# Patient Record
Sex: Male | Born: 1945 | Race: Black or African American | Hispanic: No
Health system: Southern US, Community
[De-identification: ages and names within clinical notes are randomized; demographics above are authoritative.]

## PROBLEM LIST (undated history)

## (undated) DIAGNOSIS — I10 Essential (primary) hypertension: Secondary | ICD-10-CM

---

## 1998-08-09 ENCOUNTER — Ambulatory Visit (HOSPITAL_COMMUNITY): Admission: RE | Admit: 1998-08-09 | Discharge: 1998-08-09 | Payer: Self-pay | Admitting: Internal Medicine

## 1998-08-09 ENCOUNTER — Encounter: Payer: Self-pay | Admitting: Internal Medicine

## 1999-10-24 ENCOUNTER — Encounter (HOSPITAL_BASED_OUTPATIENT_CLINIC_OR_DEPARTMENT_OTHER): Payer: Self-pay | Admitting: General Surgery

## 1999-10-26 ENCOUNTER — Ambulatory Visit (HOSPITAL_COMMUNITY): Admission: RE | Admit: 1999-10-26 | Discharge: 1999-10-26 | Payer: Self-pay | Admitting: General Surgery

## 1999-10-26 ENCOUNTER — Encounter (INDEPENDENT_AMBULATORY_CARE_PROVIDER_SITE_OTHER): Payer: Self-pay | Admitting: Specialist

## 1999-11-02 ENCOUNTER — Encounter (HOSPITAL_BASED_OUTPATIENT_CLINIC_OR_DEPARTMENT_OTHER): Payer: Self-pay | Admitting: General Surgery

## 1999-11-02 ENCOUNTER — Ambulatory Visit (HOSPITAL_COMMUNITY): Admission: RE | Admit: 1999-11-02 | Discharge: 1999-11-02 | Payer: Self-pay | Admitting: General Surgery

## 2000-10-16 ENCOUNTER — Emergency Department (HOSPITAL_COMMUNITY): Admission: EM | Admit: 2000-10-16 | Discharge: 2000-10-17 | Payer: Self-pay | Admitting: Emergency Medicine

## 2003-12-06 ENCOUNTER — Emergency Department (HOSPITAL_COMMUNITY): Admission: EM | Admit: 2003-12-06 | Discharge: 2003-12-06 | Payer: Self-pay | Admitting: Emergency Medicine

## 2008-07-04 ENCOUNTER — Emergency Department (HOSPITAL_COMMUNITY): Admission: EM | Admit: 2008-07-04 | Discharge: 2008-07-04 | Payer: Self-pay | Admitting: Emergency Medicine

## 2010-07-13 LAB — DIFFERENTIAL
Basophils Absolute: 0 10*3/uL (ref 0.0–0.1)
Lymphocytes Relative: 16 % (ref 12–46)
Monocytes Absolute: 0.3 10*3/uL (ref 0.1–1.0)
Monocytes Relative: 4 % (ref 3–12)
Neutro Abs: 5.1 10*3/uL (ref 1.7–7.7)
Neutrophils Relative %: 77 % (ref 43–77)

## 2010-07-13 LAB — CBC
HCT: 37.7 % — ABNORMAL LOW (ref 39.0–52.0)
Hemoglobin: 13 g/dL (ref 13.0–17.0)
MCHC: 34.3 g/dL (ref 30.0–36.0)
MCV: 88.4 fL (ref 78.0–100.0)
Platelets: 191 K/uL (ref 150–400)
RBC: 4.27 MIL/uL (ref 4.22–5.81)
RDW: 14.9 % (ref 11.5–15.5)
WBC: 6.6 K/uL (ref 4.0–10.5)

## 2010-07-13 LAB — COMPREHENSIVE METABOLIC PANEL WITH GFR
ALT: 21 U/L (ref 0–53)
AST: 22 U/L (ref 0–37)
Albumin: 3.5 g/dL (ref 3.5–5.2)
Alkaline Phosphatase: 56 U/L (ref 39–117)
BUN: 16 mg/dL (ref 6–23)
CO2: 26 meq/L (ref 19–32)
Calcium: 9.1 mg/dL (ref 8.4–10.5)
Chloride: 109 meq/L (ref 96–112)
Creatinine, Ser: 1.21 mg/dL (ref 0.4–1.5)
GFR calc Af Amer: >60 mL/min (ref >60)
GFR calc non Af Amer: >60 mL/min (ref >60)
Glucose, Bld: 105 mg/dL — ABNORMAL HIGH (ref 70–99)
Potassium: 4.1 meq/L (ref 3.5–5.1)
Sodium: 141 meq/L (ref 135–145)
Total Bilirubin: 0.7 mg/dL (ref 0.3–1.2)
Total Protein: 5.8 g/dL — ABNORMAL LOW (ref 6.0–8.3)

## 2010-07-13 LAB — POCT CARDIAC MARKERS
CKMB, poc: 2.1 ng/mL (ref 1.0–8.0)
Myoglobin, poc: 189 ng/mL (ref 12–200)

## 2010-08-19 NOTE — Op Note (Signed)
[place]  Patient:    Brett Porter, Brett Porter                       MRN: [id number] Proc. Date: 10/26/99 Attending:  Luisa Hart L. Lurene Shadow, M.D. CC:         Mardene Celeste. Lurene Shadow, M.D. 2 copies                           Operative Report  PREOPERATIVE DIAGNOSIS:  Hemorrhoidal disease.  POSTOPERATIVE DIAGNOSIS:  Hemorrhoidal disease.  PROCEDURE:  Proctosigmoidoscopy to 25 cm, and hemorrhoidectomy.  SURGEON:  Dr. Lurene Shadow.  ASSISTANT:  Nurse.  ANESTHESIA:  General.  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  Brought now to the operating room for same.  DESCRIPTION OF PROCEDURE:  Following the induction of anesthesia, the patient in prone position, and then jackknife, the perianal tissues are prepped and draped to be included in a sterile operative field following proctosigmoidoscopy to 25 cm which revealed no mucosal abnormalities.  The hemorrhoids located primarily in the anterior midline and posterior midline area.  The anterior midline hemorrhoid was first approached, and infiltrated with 1% Xylocaine with epinephrine.  Suture placed at the apex of the hemorrhoid by the dentate line, and an elliptical incision inclusive of two large external hemorrhoids and the large internal hemorrhoid was then made. This was carried down along onto the perianal skin, and then the hemorrhoid was dissected free from the underlying sphincter muscles.  It was removed in its entirety.  Hemostasis was assured with electrocautery.  Mucosa and mucocutaneous junction was closed with a running 2-0 chromic catgut suture. Attention then turned to the posterior midline hemorrhoid.  It was similarly treated by infiltrating the hemorrhoid with 1% Xylocaine with epinephrine. ______ ______ up at the hemorrhoid near the dentate line.   An elliptical incision was made around the hemorrhoid carrying it down across the mucosa and the mucocutaneous junction.  Hemorrhoid was dissected free from the sphincter muscles, and then removed and forwarded for pathologic evaluation.  Mucosa and mucocutaneous junction closed with a running 2-0 chromic catgut suture. Following dissection, checked for hemostasis, noted to be dry.  Sponge, needle, and instrument counts were verified.  Over each of the incision sites I placed a Xylocaine soaked Gelfoam pad as a dressing, and then placed a 4 x 4 up against the anus.  Sterile dressing was then applied.  The anesthetic was reversed.  The patient was moved from the operating room to the recovery room in stable condition, tolerating the procedure well. DD:  10/26/99 TD:  10/27/99 Job: [id number]

## 2011-12-18 ENCOUNTER — Other Ambulatory Visit: Payer: Self-pay | Admitting: Internal Medicine

## 2011-12-18 ENCOUNTER — Ambulatory Visit (HOSPITAL_COMMUNITY)
Admission: RE | Admit: 2011-12-18 | Discharge: 2011-12-18 | Disposition: A | Discharge status: Home / Self Care | Payer: Medicare Other | Source: Ambulatory Visit | Attending: Internal Medicine | Admitting: Internal Medicine

## 2011-12-18 DIAGNOSIS — Z0000 Encounter for general adult medical examination without abnormal findings: Secondary | ICD-10-CM | POA: Insufficient documentation

## 2011-12-18 DIAGNOSIS — R05 Cough: Secondary | ICD-10-CM

## 2011-12-18 DIAGNOSIS — F17200 Nicotine dependence, unspecified, uncomplicated: Secondary | ICD-10-CM | POA: Insufficient documentation

## 2011-12-18 DIAGNOSIS — R059 Cough, unspecified: Secondary | ICD-10-CM | POA: Insufficient documentation

## 2012-08-16 ENCOUNTER — Other Ambulatory Visit (INDEPENDENT_AMBULATORY_CARE_PROVIDER_SITE_OTHER): Payer: Self-pay | Admitting: Otolaryngology

## 2012-08-16 DIAGNOSIS — J320 Chronic maxillary sinusitis: Secondary | ICD-10-CM

## 2012-08-27 ENCOUNTER — Ambulatory Visit
Admission: RE | Admit: 2012-08-27 | Discharge: 2012-08-27 | Disposition: A | Discharge status: Home / Self Care | Payer: Medicare Other | Source: Ambulatory Visit | Attending: Otolaryngology | Admitting: Otolaryngology

## 2012-08-27 DIAGNOSIS — J320 Chronic maxillary sinusitis: Secondary | ICD-10-CM

## 2014-07-13 DIAGNOSIS — H35033 Hypertensive retinopathy, bilateral: Secondary | ICD-10-CM | POA: Diagnosis not present

## 2014-07-13 DIAGNOSIS — H43812 Vitreous degeneration, left eye: Secondary | ICD-10-CM | POA: Diagnosis not present

## 2014-07-13 DIAGNOSIS — H2513 Age-related nuclear cataract, bilateral: Secondary | ICD-10-CM | POA: Diagnosis not present

## 2014-07-13 DIAGNOSIS — H04129 Dry eye syndrome of unspecified lacrimal gland: Secondary | ICD-10-CM | POA: Diagnosis not present

## 2014-07-21 DIAGNOSIS — R609 Edema, unspecified: Secondary | ICD-10-CM | POA: Diagnosis not present

## 2014-07-21 DIAGNOSIS — I10 Essential (primary) hypertension: Secondary | ICD-10-CM | POA: Diagnosis not present

## 2014-07-21 DIAGNOSIS — M549 Dorsalgia, unspecified: Secondary | ICD-10-CM | POA: Diagnosis not present

## 2014-08-11 DIAGNOSIS — M47817 Spondylosis without myelopathy or radiculopathy, lumbosacral region: Secondary | ICD-10-CM | POA: Diagnosis not present

## 2014-08-13 DIAGNOSIS — R200 Anesthesia of skin: Secondary | ICD-10-CM | POA: Diagnosis not present

## 2014-08-13 DIAGNOSIS — M542 Cervicalgia: Secondary | ICD-10-CM | POA: Diagnosis not present

## 2014-09-15 DIAGNOSIS — R609 Edema, unspecified: Secondary | ICD-10-CM | POA: Diagnosis not present

## 2014-09-15 DIAGNOSIS — I10 Essential (primary) hypertension: Secondary | ICD-10-CM | POA: Diagnosis not present

## 2014-09-15 DIAGNOSIS — M549 Dorsalgia, unspecified: Secondary | ICD-10-CM | POA: Diagnosis not present

## 2014-09-21 DIAGNOSIS — M5412 Radiculopathy, cervical region: Secondary | ICD-10-CM | POA: Diagnosis not present

## 2014-09-23 DIAGNOSIS — M5412 Radiculopathy, cervical region: Secondary | ICD-10-CM | POA: Diagnosis not present

## 2014-09-28 DIAGNOSIS — M5412 Radiculopathy, cervical region: Secondary | ICD-10-CM | POA: Diagnosis not present

## 2014-10-12 DIAGNOSIS — M5412 Radiculopathy, cervical region: Secondary | ICD-10-CM | POA: Diagnosis not present

## 2014-10-14 DIAGNOSIS — M5412 Radiculopathy, cervical region: Secondary | ICD-10-CM | POA: Diagnosis not present

## 2014-12-21 DIAGNOSIS — M545 Low back pain: Secondary | ICD-10-CM | POA: Diagnosis not present

## 2014-12-21 DIAGNOSIS — M47816 Spondylosis without myelopathy or radiculopathy, lumbar region: Secondary | ICD-10-CM | POA: Diagnosis not present

## 2014-12-21 DIAGNOSIS — M47817 Spondylosis without myelopathy or radiculopathy, lumbosacral region: Secondary | ICD-10-CM | POA: Diagnosis not present

## 2015-01-04 DIAGNOSIS — I10 Essential (primary) hypertension: Secondary | ICD-10-CM | POA: Diagnosis not present

## 2015-01-04 DIAGNOSIS — R609 Edema, unspecified: Secondary | ICD-10-CM | POA: Diagnosis not present

## 2015-01-04 DIAGNOSIS — M549 Dorsalgia, unspecified: Secondary | ICD-10-CM | POA: Diagnosis not present

## 2015-01-04 DIAGNOSIS — J029 Acute pharyngitis, unspecified: Secondary | ICD-10-CM | POA: Diagnosis not present

## 2015-01-13 DIAGNOSIS — M47817 Spondylosis without myelopathy or radiculopathy, lumbosacral region: Secondary | ICD-10-CM | POA: Diagnosis not present

## 2015-02-17 DIAGNOSIS — M5412 Radiculopathy, cervical region: Secondary | ICD-10-CM | POA: Diagnosis not present

## 2015-02-19 DIAGNOSIS — M5412 Radiculopathy, cervical region: Secondary | ICD-10-CM | POA: Diagnosis not present

## 2015-03-09 DIAGNOSIS — J40 Bronchitis, not specified as acute or chronic: Secondary | ICD-10-CM | POA: Diagnosis not present

## 2015-03-09 DIAGNOSIS — J309 Allergic rhinitis, unspecified: Secondary | ICD-10-CM | POA: Diagnosis not present

## 2015-04-27 DIAGNOSIS — J40 Bronchitis, not specified as acute or chronic: Secondary | ICD-10-CM | POA: Diagnosis not present

## 2015-04-27 DIAGNOSIS — R609 Edema, unspecified: Secondary | ICD-10-CM | POA: Diagnosis not present

## 2015-04-27 DIAGNOSIS — I10 Essential (primary) hypertension: Secondary | ICD-10-CM | POA: Diagnosis not present

## 2015-04-27 DIAGNOSIS — M549 Dorsalgia, unspecified: Secondary | ICD-10-CM | POA: Diagnosis not present

## 2015-07-12 DIAGNOSIS — I10 Essential (primary) hypertension: Secondary | ICD-10-CM | POA: Diagnosis not present

## 2015-09-06 DIAGNOSIS — Z125 Encounter for screening for malignant neoplasm of prostate: Secondary | ICD-10-CM | POA: Diagnosis not present

## 2015-09-06 DIAGNOSIS — I10 Essential (primary) hypertension: Secondary | ICD-10-CM | POA: Diagnosis not present

## 2015-09-06 DIAGNOSIS — E7800 Pure hypercholesterolemia, unspecified: Secondary | ICD-10-CM | POA: Diagnosis not present

## 2015-09-06 DIAGNOSIS — E559 Vitamin D deficiency, unspecified: Secondary | ICD-10-CM | POA: Diagnosis not present

## 2015-11-16 DIAGNOSIS — Z0001 Encounter for general adult medical examination with abnormal findings: Secondary | ICD-10-CM | POA: Diagnosis not present

## 2015-12-02 DIAGNOSIS — R609 Edema, unspecified: Secondary | ICD-10-CM | POA: Diagnosis not present

## 2015-12-02 DIAGNOSIS — J029 Acute pharyngitis, unspecified: Secondary | ICD-10-CM | POA: Diagnosis not present

## 2015-12-02 DIAGNOSIS — I10 Essential (primary) hypertension: Secondary | ICD-10-CM | POA: Diagnosis not present

## 2015-12-02 DIAGNOSIS — M549 Dorsalgia, unspecified: Secondary | ICD-10-CM | POA: Diagnosis not present

## 2016-03-14 DIAGNOSIS — M47816 Spondylosis without myelopathy or radiculopathy, lumbar region: Secondary | ICD-10-CM | POA: Diagnosis not present

## 2018-01-29 ENCOUNTER — Other Ambulatory Visit: Payer: Self-pay | Admitting: Physician Assistant

## 2018-01-29 DIAGNOSIS — M25561 Pain in right knee: Secondary | ICD-10-CM

## 2018-02-05 ENCOUNTER — Ambulatory Visit
Admission: RE | Admit: 2018-02-05 | Discharge: 2018-02-05 | Disposition: A | Discharge status: Home / Self Care | Payer: Medicare Other | Source: Ambulatory Visit | Attending: Physician Assistant | Admitting: Physician Assistant

## 2018-02-05 DIAGNOSIS — M25561 Pain in right knee: Secondary | ICD-10-CM | POA: Diagnosis not present

## 2018-02-12 DIAGNOSIS — M1711 Unilateral primary osteoarthritis, right knee: Secondary | ICD-10-CM | POA: Diagnosis not present

## 2018-02-12 DIAGNOSIS — S83241D Other tear of medial meniscus, current injury, right knee, subsequent encounter: Secondary | ICD-10-CM | POA: Diagnosis not present

## 2018-02-22 ENCOUNTER — Other Ambulatory Visit (HOSPITAL_COMMUNITY): Payer: Self-pay | Admitting: Internal Medicine

## 2018-02-22 DIAGNOSIS — Z23 Encounter for immunization: Secondary | ICD-10-CM | POA: Diagnosis not present

## 2018-02-22 DIAGNOSIS — F17209 Nicotine dependence, unspecified, with unspecified nicotine-induced disorders: Secondary | ICD-10-CM

## 2018-02-22 DIAGNOSIS — Z1389 Encounter for screening for other disorder: Secondary | ICD-10-CM | POA: Diagnosis not present

## 2018-02-22 DIAGNOSIS — N182 Chronic kidney disease, stage 2 (mild): Secondary | ICD-10-CM | POA: Diagnosis not present

## 2018-02-22 DIAGNOSIS — I10 Essential (primary) hypertension: Secondary | ICD-10-CM | POA: Diagnosis not present

## 2018-02-22 DIAGNOSIS — Z0001 Encounter for general adult medical examination with abnormal findings: Secondary | ICD-10-CM | POA: Diagnosis not present

## 2018-02-27 DIAGNOSIS — M1711 Unilateral primary osteoarthritis, right knee: Secondary | ICD-10-CM | POA: Diagnosis not present

## 2018-03-01 ENCOUNTER — Ambulatory Visit (HOSPITAL_COMMUNITY): Payer: Medicare Other

## 2018-03-05 DIAGNOSIS — M1711 Unilateral primary osteoarthritis, right knee: Secondary | ICD-10-CM | POA: Diagnosis not present

## 2018-03-12 DIAGNOSIS — M1711 Unilateral primary osteoarthritis, right knee: Secondary | ICD-10-CM | POA: Diagnosis not present

## 2018-04-08 ENCOUNTER — Ambulatory Visit (HOSPITAL_COMMUNITY)
Admission: RE | Admit: 2018-04-08 | Discharge: 2018-04-08 | Disposition: A | Discharge status: Home / Self Care | Payer: Medicare Other | Source: Ambulatory Visit | Attending: Internal Medicine | Admitting: Internal Medicine

## 2018-04-08 DIAGNOSIS — F17209 Nicotine dependence, unspecified, with unspecified nicotine-induced disorders: Secondary | ICD-10-CM | POA: Diagnosis present

## 2018-04-08 DIAGNOSIS — F17200 Nicotine dependence, unspecified, uncomplicated: Secondary | ICD-10-CM | POA: Diagnosis not present

## 2018-04-08 DIAGNOSIS — I700 Atherosclerosis of aorta: Secondary | ICD-10-CM | POA: Diagnosis not present

## 2018-04-08 DIAGNOSIS — J432 Centrilobular emphysema: Secondary | ICD-10-CM | POA: Insufficient documentation

## 2018-04-08 DIAGNOSIS — I2510 Atherosclerotic heart disease of native coronary artery without angina pectoris: Secondary | ICD-10-CM | POA: Insufficient documentation

## 2018-04-08 DIAGNOSIS — Z122 Encounter for screening for malignant neoplasm of respiratory organs: Secondary | ICD-10-CM | POA: Diagnosis not present

## 2018-04-11 DIAGNOSIS — N182 Chronic kidney disease, stage 2 (mild): Secondary | ICD-10-CM | POA: Diagnosis not present

## 2018-04-11 DIAGNOSIS — I10 Essential (primary) hypertension: Secondary | ICD-10-CM | POA: Diagnosis not present

## 2018-04-11 DIAGNOSIS — J00 Acute nasopharyngitis [common cold]: Secondary | ICD-10-CM | POA: Diagnosis not present

## 2018-06-20 DIAGNOSIS — J029 Acute pharyngitis, unspecified: Secondary | ICD-10-CM | POA: Diagnosis not present

## 2018-06-20 DIAGNOSIS — I10 Essential (primary) hypertension: Secondary | ICD-10-CM | POA: Diagnosis not present

## 2018-10-01 DIAGNOSIS — I10 Essential (primary) hypertension: Secondary | ICD-10-CM | POA: Diagnosis not present

## 2018-10-01 DIAGNOSIS — N183 Chronic kidney disease, stage 3 (moderate): Secondary | ICD-10-CM | POA: Diagnosis not present

## 2018-10-01 DIAGNOSIS — Z1389 Encounter for screening for other disorder: Secondary | ICD-10-CM | POA: Diagnosis not present

## 2018-10-01 DIAGNOSIS — M549 Dorsalgia, unspecified: Secondary | ICD-10-CM | POA: Diagnosis not present

## 2018-10-01 DIAGNOSIS — Z0001 Encounter for general adult medical examination with abnormal findings: Secondary | ICD-10-CM | POA: Diagnosis not present

## 2018-10-24 DIAGNOSIS — M5136 Other intervertebral disc degeneration, lumbar region: Secondary | ICD-10-CM | POA: Diagnosis not present

## 2018-10-31 DIAGNOSIS — I10 Essential (primary) hypertension: Secondary | ICD-10-CM | POA: Diagnosis not present

## 2018-10-31 DIAGNOSIS — N183 Chronic kidney disease, stage 3 (moderate): Secondary | ICD-10-CM | POA: Diagnosis not present

## 2018-11-21 DIAGNOSIS — M1711 Unilateral primary osteoarthritis, right knee: Secondary | ICD-10-CM | POA: Diagnosis not present

## 2018-11-26 DIAGNOSIS — Z0001 Encounter for general adult medical examination with abnormal findings: Secondary | ICD-10-CM | POA: Diagnosis not present

## 2018-11-26 DIAGNOSIS — J329 Chronic sinusitis, unspecified: Secondary | ICD-10-CM | POA: Diagnosis not present

## 2018-11-26 DIAGNOSIS — I10 Essential (primary) hypertension: Secondary | ICD-10-CM | POA: Diagnosis not present

## 2018-11-28 DIAGNOSIS — M1711 Unilateral primary osteoarthritis, right knee: Secondary | ICD-10-CM | POA: Diagnosis not present

## 2018-12-03 DIAGNOSIS — J40 Bronchitis, not specified as acute or chronic: Secondary | ICD-10-CM | POA: Diagnosis not present

## 2018-12-03 DIAGNOSIS — J309 Allergic rhinitis, unspecified: Secondary | ICD-10-CM | POA: Diagnosis not present

## 2018-12-05 DIAGNOSIS — M1711 Unilateral primary osteoarthritis, right knee: Secondary | ICD-10-CM | POA: Diagnosis not present

## 2018-12-23 DIAGNOSIS — H43813 Vitreous degeneration, bilateral: Secondary | ICD-10-CM | POA: Diagnosis not present

## 2018-12-23 DIAGNOSIS — H524 Presbyopia: Secondary | ICD-10-CM | POA: Diagnosis not present

## 2018-12-23 DIAGNOSIS — H2513 Age-related nuclear cataract, bilateral: Secondary | ICD-10-CM | POA: Diagnosis not present

## 2018-12-23 DIAGNOSIS — H35033 Hypertensive retinopathy, bilateral: Secondary | ICD-10-CM | POA: Diagnosis not present

## 2018-12-23 DIAGNOSIS — H35363 Drusen (degenerative) of macula, bilateral: Secondary | ICD-10-CM | POA: Diagnosis not present

## 2019-01-02 DIAGNOSIS — N183 Chronic kidney disease, stage 3 unspecified: Secondary | ICD-10-CM | POA: Diagnosis not present

## 2019-01-02 DIAGNOSIS — I10 Essential (primary) hypertension: Secondary | ICD-10-CM | POA: Diagnosis not present

## 2019-02-02 DIAGNOSIS — N183 Chronic kidney disease, stage 3 unspecified: Secondary | ICD-10-CM | POA: Diagnosis not present

## 2019-02-02 DIAGNOSIS — I10 Essential (primary) hypertension: Secondary | ICD-10-CM | POA: Diagnosis not present

## 2019-02-11 DIAGNOSIS — M549 Dorsalgia, unspecified: Secondary | ICD-10-CM | POA: Diagnosis not present

## 2019-02-11 DIAGNOSIS — Z23 Encounter for immunization: Secondary | ICD-10-CM | POA: Diagnosis not present

## 2019-02-11 DIAGNOSIS — I10 Essential (primary) hypertension: Secondary | ICD-10-CM | POA: Diagnosis not present

## 2019-02-11 DIAGNOSIS — N1831 Chronic kidney disease, stage 3a: Secondary | ICD-10-CM | POA: Diagnosis not present

## 2019-02-26 DIAGNOSIS — I10 Essential (primary) hypertension: Secondary | ICD-10-CM | POA: Diagnosis not present

## 2019-02-26 DIAGNOSIS — N183 Chronic kidney disease, stage 3 unspecified: Secondary | ICD-10-CM | POA: Diagnosis not present

## 2019-03-13 DIAGNOSIS — N1831 Chronic kidney disease, stage 3a: Secondary | ICD-10-CM | POA: Diagnosis not present

## 2019-03-13 DIAGNOSIS — M5136 Other intervertebral disc degeneration, lumbar region: Secondary | ICD-10-CM | POA: Diagnosis not present

## 2019-03-13 DIAGNOSIS — I10 Essential (primary) hypertension: Secondary | ICD-10-CM | POA: Diagnosis not present

## 2019-04-13 DIAGNOSIS — I10 Essential (primary) hypertension: Secondary | ICD-10-CM | POA: Diagnosis not present

## 2019-04-13 DIAGNOSIS — M549 Dorsalgia, unspecified: Secondary | ICD-10-CM | POA: Diagnosis not present

## 2019-04-21 DIAGNOSIS — H35033 Hypertensive retinopathy, bilateral: Secondary | ICD-10-CM | POA: Diagnosis not present

## 2019-04-21 DIAGNOSIS — H3561 Retinal hemorrhage, right eye: Secondary | ICD-10-CM | POA: Diagnosis not present

## 2019-05-14 DIAGNOSIS — N1831 Chronic kidney disease, stage 3a: Secondary | ICD-10-CM | POA: Diagnosis not present

## 2019-05-14 DIAGNOSIS — J329 Chronic sinusitis, unspecified: Secondary | ICD-10-CM | POA: Diagnosis not present

## 2019-06-09 DIAGNOSIS — R369 Urethral discharge, unspecified: Secondary | ICD-10-CM | POA: Diagnosis not present

## 2019-06-12 DIAGNOSIS — N1831 Chronic kidney disease, stage 3a: Secondary | ICD-10-CM | POA: Diagnosis not present

## 2019-06-12 DIAGNOSIS — J329 Chronic sinusitis, unspecified: Secondary | ICD-10-CM | POA: Diagnosis not present

## 2019-07-09 DIAGNOSIS — M1711 Unilateral primary osteoarthritis, right knee: Secondary | ICD-10-CM | POA: Diagnosis not present

## 2019-07-28 DIAGNOSIS — Z0001 Encounter for general adult medical examination with abnormal findings: Secondary | ICD-10-CM | POA: Diagnosis not present

## 2019-07-28 DIAGNOSIS — Z1389 Encounter for screening for other disorder: Secondary | ICD-10-CM | POA: Diagnosis not present

## 2019-07-28 DIAGNOSIS — F17210 Nicotine dependence, cigarettes, uncomplicated: Secondary | ICD-10-CM | POA: Diagnosis not present

## 2019-07-28 DIAGNOSIS — E11649 Type 2 diabetes mellitus with hypoglycemia without coma: Secondary | ICD-10-CM | POA: Diagnosis not present

## 2019-07-28 DIAGNOSIS — E039 Hypothyroidism, unspecified: Secondary | ICD-10-CM | POA: Diagnosis not present

## 2019-07-30 DIAGNOSIS — M1711 Unilateral primary osteoarthritis, right knee: Secondary | ICD-10-CM | POA: Diagnosis not present

## 2019-08-05 DIAGNOSIS — M1711 Unilateral primary osteoarthritis, right knee: Secondary | ICD-10-CM | POA: Diagnosis not present

## 2019-08-12 DIAGNOSIS — M1711 Unilateral primary osteoarthritis, right knee: Secondary | ICD-10-CM | POA: Diagnosis not present

## 2019-08-15 ENCOUNTER — Other Ambulatory Visit: Payer: Self-pay | Admitting: Internal Medicine

## 2019-08-15 ENCOUNTER — Other Ambulatory Visit (HOSPITAL_COMMUNITY): Payer: Self-pay | Admitting: Internal Medicine

## 2019-08-15 DIAGNOSIS — Z87891 Personal history of nicotine dependence: Secondary | ICD-10-CM

## 2019-08-27 DIAGNOSIS — N1831 Chronic kidney disease, stage 3a: Secondary | ICD-10-CM | POA: Diagnosis not present

## 2019-08-27 DIAGNOSIS — I10 Essential (primary) hypertension: Secondary | ICD-10-CM | POA: Diagnosis not present

## 2019-09-02 ENCOUNTER — Ambulatory Visit (HOSPITAL_COMMUNITY): Admission: RE | Admit: 2019-09-02 | Payer: Medicare Other | Source: Ambulatory Visit

## 2019-09-22 ENCOUNTER — Other Ambulatory Visit: Payer: Self-pay

## 2019-09-22 ENCOUNTER — Ambulatory Visit (HOSPITAL_COMMUNITY)
Admission: RE | Admit: 2019-09-22 | Discharge: 2019-09-22 | Disposition: A | Discharge status: Home / Self Care | Payer: Medicare Other | Source: Ambulatory Visit | Attending: Internal Medicine | Admitting: Internal Medicine

## 2019-09-22 DIAGNOSIS — I10 Essential (primary) hypertension: Secondary | ICD-10-CM | POA: Diagnosis not present

## 2019-09-22 DIAGNOSIS — L84 Corns and callosities: Secondary | ICD-10-CM | POA: Diagnosis not present

## 2019-09-22 DIAGNOSIS — Z79899 Other long term (current) drug therapy: Secondary | ICD-10-CM | POA: Diagnosis not present

## 2019-09-22 DIAGNOSIS — Z87891 Personal history of nicotine dependence: Secondary | ICD-10-CM | POA: Diagnosis not present

## 2019-09-22 DIAGNOSIS — N183 Chronic kidney disease, stage 3 unspecified: Secondary | ICD-10-CM | POA: Diagnosis not present

## 2019-09-22 DIAGNOSIS — N1831 Chronic kidney disease, stage 3a: Secondary | ICD-10-CM | POA: Diagnosis not present

## 2019-10-15 DIAGNOSIS — G894 Chronic pain syndrome: Secondary | ICD-10-CM | POA: Diagnosis not present

## 2019-10-22 DIAGNOSIS — I10 Essential (primary) hypertension: Secondary | ICD-10-CM | POA: Diagnosis not present

## 2019-10-22 DIAGNOSIS — N1831 Chronic kidney disease, stage 3a: Secondary | ICD-10-CM | POA: Diagnosis not present

## 2019-11-22 DIAGNOSIS — I10 Essential (primary) hypertension: Secondary | ICD-10-CM | POA: Diagnosis not present

## 2019-11-22 DIAGNOSIS — N1831 Chronic kidney disease, stage 3a: Secondary | ICD-10-CM | POA: Diagnosis not present

## 2019-12-23 DIAGNOSIS — I10 Essential (primary) hypertension: Secondary | ICD-10-CM | POA: Diagnosis not present

## 2019-12-23 DIAGNOSIS — N1831 Chronic kidney disease, stage 3a: Secondary | ICD-10-CM | POA: Diagnosis not present

## 2019-12-30 DIAGNOSIS — H35363 Drusen (degenerative) of macula, bilateral: Secondary | ICD-10-CM | POA: Diagnosis not present

## 2019-12-30 DIAGNOSIS — H2513 Age-related nuclear cataract, bilateral: Secondary | ICD-10-CM | POA: Diagnosis not present

## 2019-12-30 DIAGNOSIS — H35033 Hypertensive retinopathy, bilateral: Secondary | ICD-10-CM | POA: Diagnosis not present

## 2019-12-30 DIAGNOSIS — H43813 Vitreous degeneration, bilateral: Secondary | ICD-10-CM | POA: Diagnosis not present

## 2019-12-30 DIAGNOSIS — H3561 Retinal hemorrhage, right eye: Secondary | ICD-10-CM | POA: Diagnosis not present

## 2020-01-09 DIAGNOSIS — Z23 Encounter for immunization: Secondary | ICD-10-CM | POA: Diagnosis not present

## 2020-01-19 DIAGNOSIS — L732 Hidradenitis suppurativa: Secondary | ICD-10-CM | POA: Diagnosis not present

## 2020-01-19 DIAGNOSIS — I10 Essential (primary) hypertension: Secondary | ICD-10-CM | POA: Diagnosis not present

## 2020-01-19 DIAGNOSIS — J329 Chronic sinusitis, unspecified: Secondary | ICD-10-CM | POA: Diagnosis not present

## 2020-01-19 DIAGNOSIS — N1831 Chronic kidney disease, stage 3a: Secondary | ICD-10-CM | POA: Diagnosis not present

## 2020-02-05 ENCOUNTER — Ambulatory Visit: Payer: Medicare Other | Attending: Internal Medicine

## 2020-02-05 DIAGNOSIS — Z23 Encounter for immunization: Secondary | ICD-10-CM

## 2020-02-05 NOTE — Progress Notes (Signed)
   Covid-19 Vaccination Clinic  Name:  Brett Porter    MRN: [id number] DOB: 06/20/1945  02/05/2020  Brett Porter was observed post Covid-19 immunization for 15 minutes without incident. He was provided with Vaccine Information Sheet and instruction to access the V-Safe system.   Brett Porter was instructed to call 911 with any severe reactions post vaccine: Marland Kitchen Difficulty breathing  . Swelling of face and throat  . A fast heartbeat  . A bad rash all over body  . Dizziness and weakness

## 2020-02-24 DIAGNOSIS — I10 Essential (primary) hypertension: Secondary | ICD-10-CM | POA: Diagnosis not present

## 2020-02-24 DIAGNOSIS — F17210 Nicotine dependence, cigarettes, uncomplicated: Secondary | ICD-10-CM | POA: Diagnosis not present

## 2020-02-24 DIAGNOSIS — J029 Acute pharyngitis, unspecified: Secondary | ICD-10-CM | POA: Diagnosis not present

## 2020-02-24 DIAGNOSIS — J40 Bronchitis, not specified as acute or chronic: Secondary | ICD-10-CM | POA: Diagnosis not present

## 2020-02-24 DIAGNOSIS — F17290 Nicotine dependence, other tobacco product, uncomplicated: Secondary | ICD-10-CM | POA: Diagnosis not present

## 2020-03-10 DIAGNOSIS — M25561 Pain in right knee: Secondary | ICD-10-CM | POA: Diagnosis not present

## 2020-03-17 DIAGNOSIS — M1711 Unilateral primary osteoarthritis, right knee: Secondary | ICD-10-CM | POA: Diagnosis not present

## 2020-03-24 DIAGNOSIS — M1711 Unilateral primary osteoarthritis, right knee: Secondary | ICD-10-CM | POA: Diagnosis not present

## 2020-03-25 DIAGNOSIS — M549 Dorsalgia, unspecified: Secondary | ICD-10-CM | POA: Diagnosis not present

## 2020-03-25 DIAGNOSIS — I10 Essential (primary) hypertension: Secondary | ICD-10-CM | POA: Diagnosis not present

## 2020-04-20 DIAGNOSIS — M5417 Radiculopathy, lumbosacral region: Secondary | ICD-10-CM | POA: Diagnosis not present

## 2020-04-27 DIAGNOSIS — F17290 Nicotine dependence, other tobacco product, uncomplicated: Secondary | ICD-10-CM | POA: Diagnosis not present

## 2020-04-27 DIAGNOSIS — K219 Gastro-esophageal reflux disease without esophagitis: Secondary | ICD-10-CM | POA: Diagnosis not present

## 2020-04-27 DIAGNOSIS — N1831 Chronic kidney disease, stage 3a: Secondary | ICD-10-CM | POA: Diagnosis not present

## 2020-04-27 DIAGNOSIS — I10 Essential (primary) hypertension: Secondary | ICD-10-CM | POA: Diagnosis not present

## 2020-05-24 DIAGNOSIS — M25561 Pain in right knee: Secondary | ICD-10-CM | POA: Diagnosis not present

## 2020-05-28 DIAGNOSIS — I10 Essential (primary) hypertension: Secondary | ICD-10-CM | POA: Diagnosis not present

## 2020-05-28 DIAGNOSIS — F17290 Nicotine dependence, other tobacco product, uncomplicated: Secondary | ICD-10-CM | POA: Diagnosis not present

## 2020-06-25 DIAGNOSIS — I10 Essential (primary) hypertension: Secondary | ICD-10-CM | POA: Diagnosis not present

## 2020-06-25 DIAGNOSIS — F17210 Nicotine dependence, cigarettes, uncomplicated: Secondary | ICD-10-CM | POA: Diagnosis not present

## 2020-07-12 DIAGNOSIS — Z1389 Encounter for screening for other disorder: Secondary | ICD-10-CM | POA: Diagnosis not present

## 2020-07-12 DIAGNOSIS — I10 Essential (primary) hypertension: Secondary | ICD-10-CM | POA: Diagnosis not present

## 2020-07-12 DIAGNOSIS — Z0001 Encounter for general adult medical examination with abnormal findings: Secondary | ICD-10-CM | POA: Diagnosis not present

## 2020-07-12 DIAGNOSIS — J329 Chronic sinusitis, unspecified: Secondary | ICD-10-CM | POA: Diagnosis not present

## 2020-07-12 DIAGNOSIS — Z79899 Other long term (current) drug therapy: Secondary | ICD-10-CM | POA: Diagnosis not present

## 2020-07-12 DIAGNOSIS — F17210 Nicotine dependence, cigarettes, uncomplicated: Secondary | ICD-10-CM | POA: Diagnosis not present

## 2020-07-12 DIAGNOSIS — N1831 Chronic kidney disease, stage 3a: Secondary | ICD-10-CM | POA: Diagnosis not present

## 2020-07-27 IMAGING — CT CT CHEST LUNG CANCER SCREENING LOW DOSE W/O CM
2 of 3 series · 15 of 36 positions shown, 18 images · non-contrast
Comparison: 04/08/2018

CLINICAL DATA: 73-year-old male with 50 pack-year history of
smoking. Lung cancer screening.

EXAM:
CT CHEST WITHOUT CONTRAST LOW-DOSE FOR LUNG CANCER SCREENING
TECHNIQUE: Multidetector CT imaging of the chest was performed following the
standard protocol without IV contrast.

[Series 2: axial st · axial · 0.65mm/px · z∈[-20,+240]mm · 12 of 62 slices shown, 15 images]
[im 5/62  mediastinal]
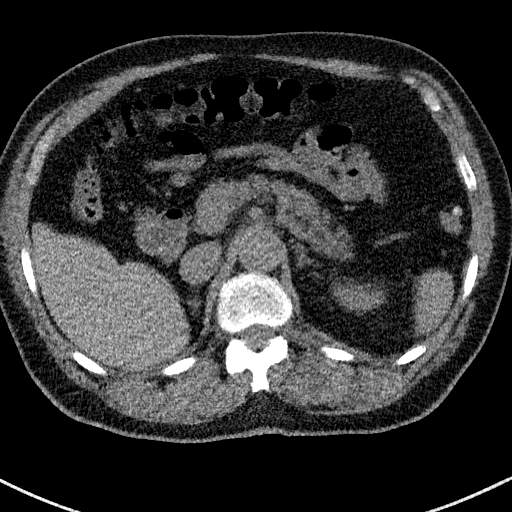
[im 5/62  lung]
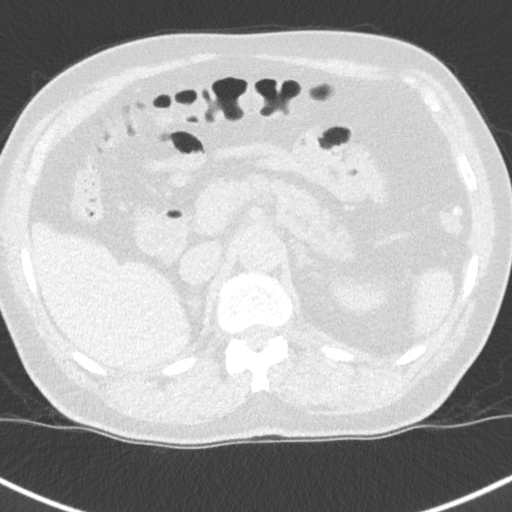
[im 10/62  lung]
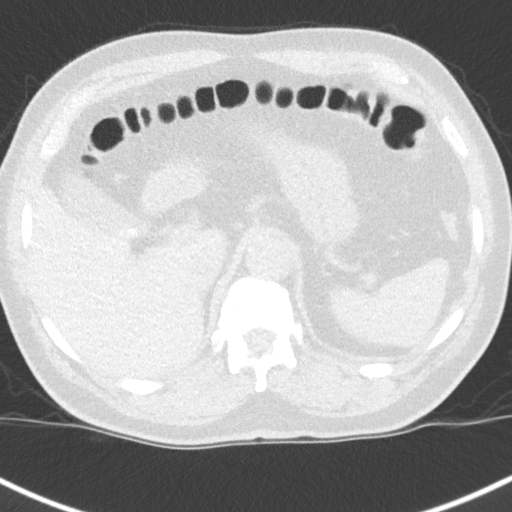
[im 14/62  lung]
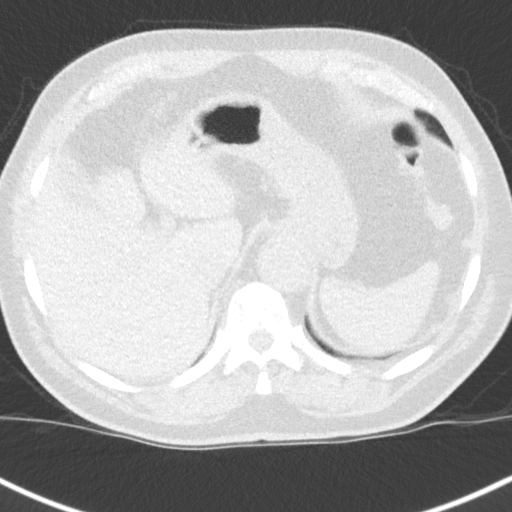
[im 19/62  lung]
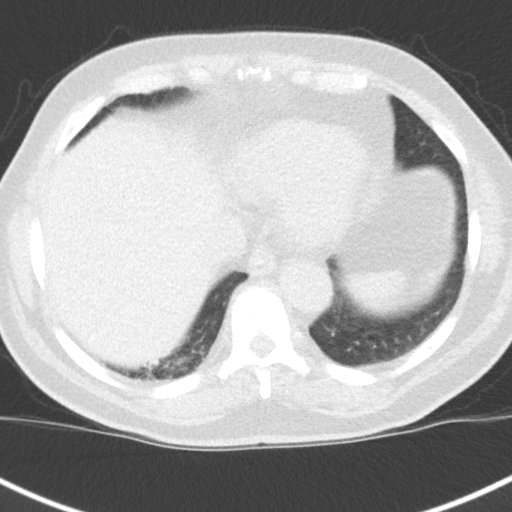
[im 23/62  mediastinal]
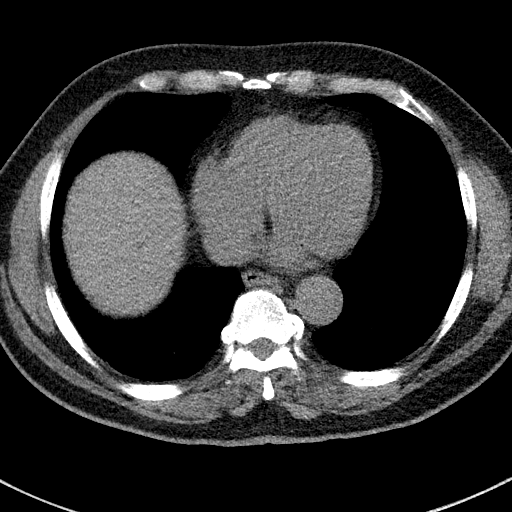
[im 23/62  lung]
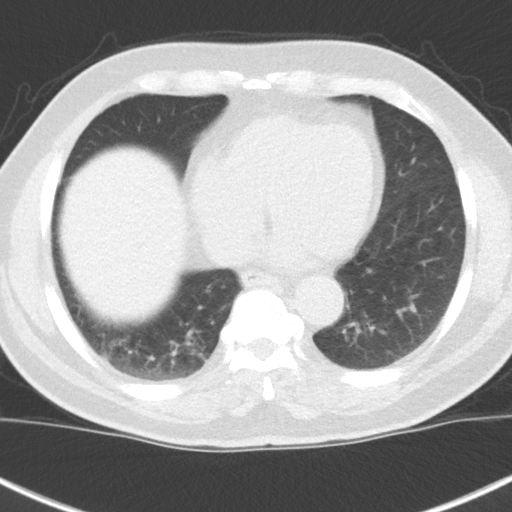
[im 28/62  lung]
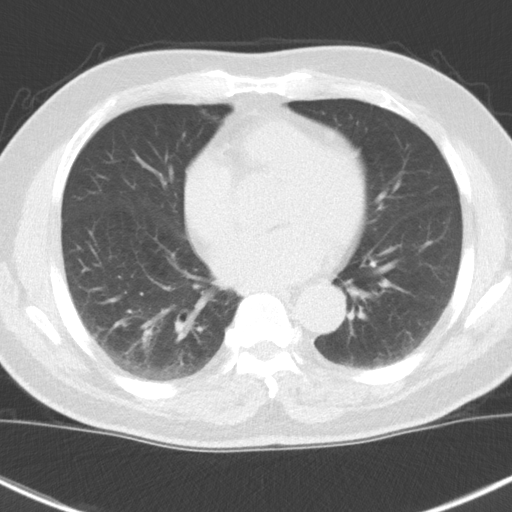
[im 34/62  lung]
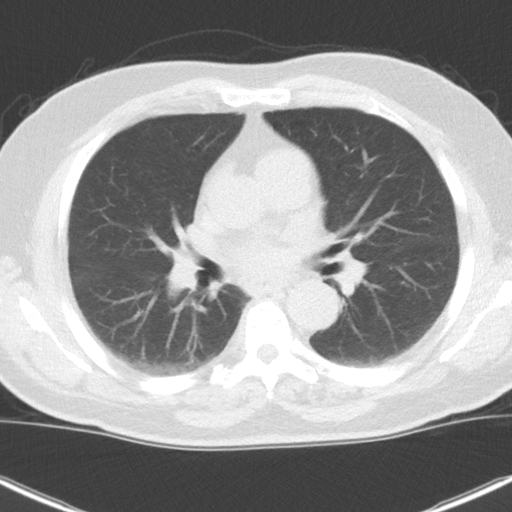
[im 39/62  lung]
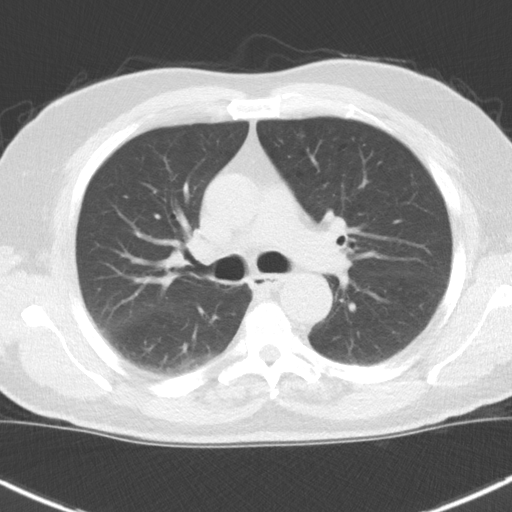
[im 43/62  mediastinal]
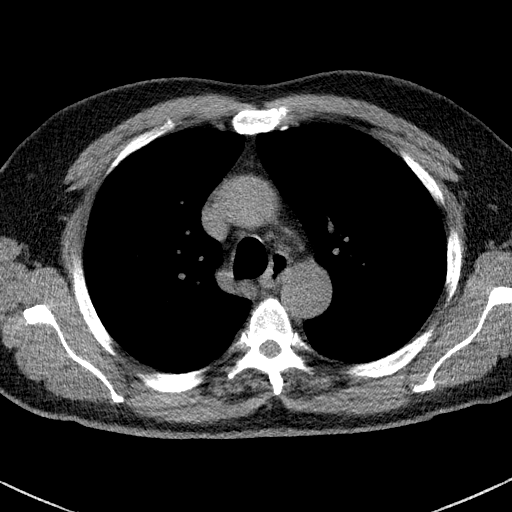
[im 43/62  lung]
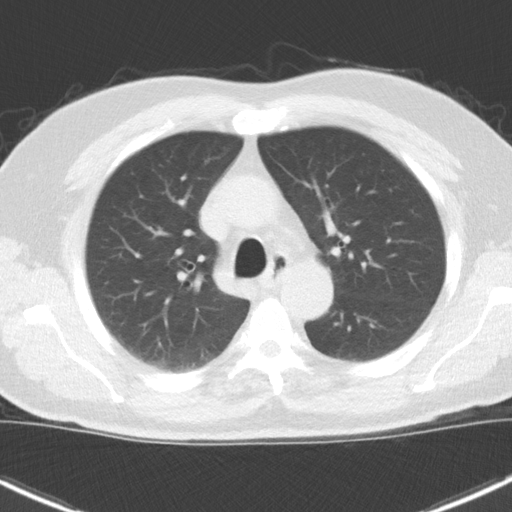
[im 48/62  lung]
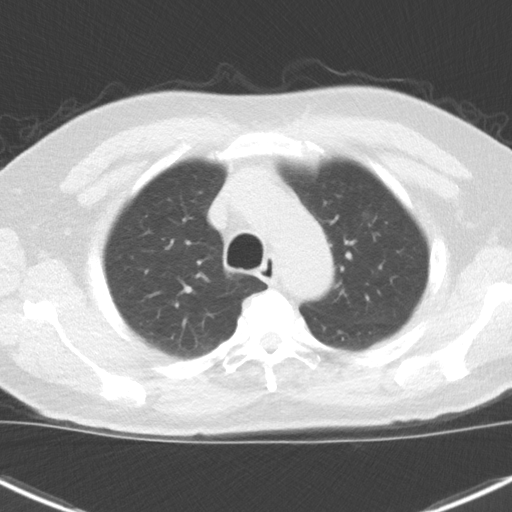
[im 52/62  lung]
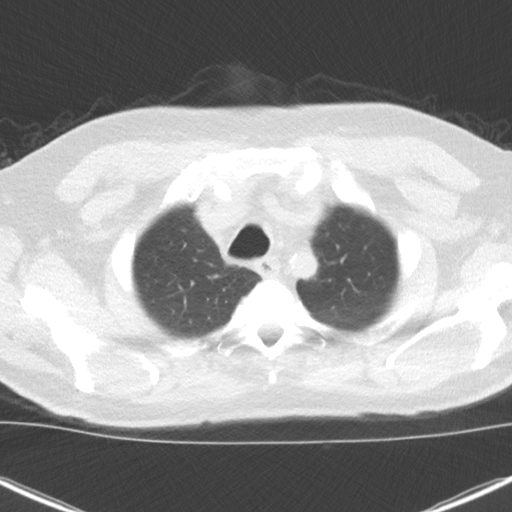
[im 57/62  lung]
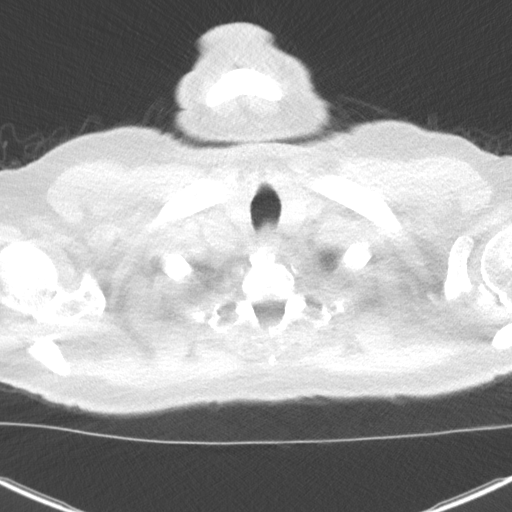

[Series 5: coronal · coronal · 0.61mm/px · 3 of 260 slices shown]
[im 52/260  lung]
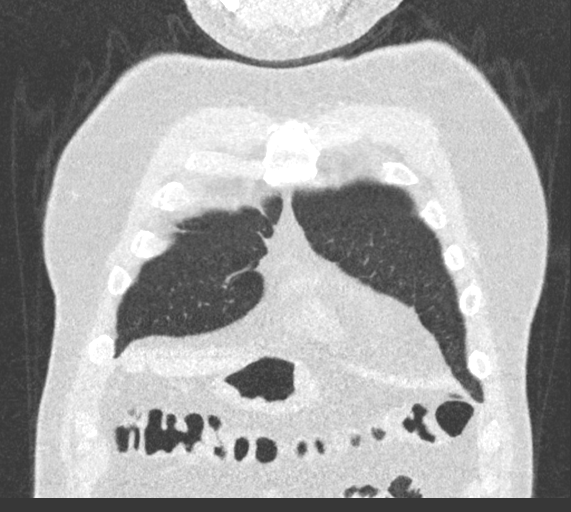
[im 104/260  lung]
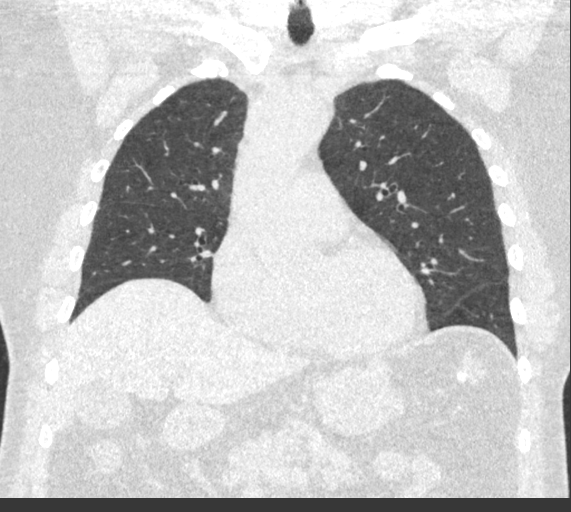
[im 156/260  lung]
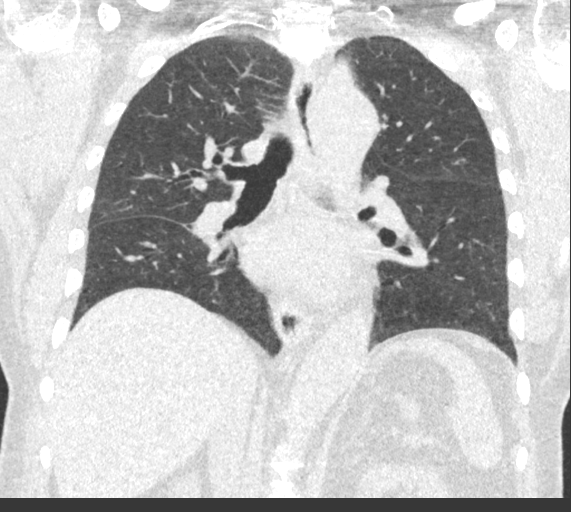

[15 of 36 positions shown; findings below may reference images not displayed]

FINDINGS: Cardiovascular: The heart size is normal. No substantial pericardial
effusion. Coronary artery calcification is evident. Atherosclerotic
calcification is noted in the wall of the thoracic aorta.

Mediastinum/Nodes: No mediastinal lymphadenopathy. No evidence for
gross hilar lymphadenopathy although assessment is limited by the
lack of intravenous contrast on today's study. The esophagus has
normal imaging features. There is no axillary lymphadenopathy.

Lungs/Pleura: Centrilobular emphsyema noted. Tiny posterior left
lower lobe pulmonary nodule identified previously has resolved in
the interval. Ground-glass opacity anterior left upper lobe is
stable. No new suspicious pulmonary nodule or mass. No focal
airspace consolidation. No pleural effusion.

Upper Abdomen: Small calcified gallstone evident. Otherwise
unremarkable.

Musculoskeletal: No worrisome lytic or sclerotic osseous
abnormality.
IMPRESSION: 1. Lung-RADS 2, benign appearance or behavior. Continue annual
screening with low-dose chest CT without contrast in 12 months.
2. Cholelithiasis.
3. Aortic Atherosclerosis (KN08F-HJU.U) and Emphysema (KN08F-1OT.2).

## 2020-08-11 DIAGNOSIS — F17290 Nicotine dependence, other tobacco product, uncomplicated: Secondary | ICD-10-CM | POA: Diagnosis not present

## 2020-08-11 DIAGNOSIS — I10 Essential (primary) hypertension: Secondary | ICD-10-CM | POA: Diagnosis not present

## 2020-08-25 DIAGNOSIS — M170 Bilateral primary osteoarthritis of knee: Secondary | ICD-10-CM | POA: Diagnosis not present

## 2020-08-25 DIAGNOSIS — M1711 Unilateral primary osteoarthritis, right knee: Secondary | ICD-10-CM | POA: Diagnosis not present

## 2020-09-11 DIAGNOSIS — I10 Essential (primary) hypertension: Secondary | ICD-10-CM | POA: Diagnosis not present

## 2020-09-11 DIAGNOSIS — J329 Chronic sinusitis, unspecified: Secondary | ICD-10-CM | POA: Diagnosis not present

## 2020-10-11 DIAGNOSIS — N1831 Chronic kidney disease, stage 3a: Secondary | ICD-10-CM | POA: Diagnosis not present

## 2020-10-11 DIAGNOSIS — I10 Essential (primary) hypertension: Secondary | ICD-10-CM | POA: Diagnosis not present

## 2020-10-16 DIAGNOSIS — M5416 Radiculopathy, lumbar region: Secondary | ICD-10-CM | POA: Diagnosis not present

## 2020-11-11 DIAGNOSIS — I10 Essential (primary) hypertension: Secondary | ICD-10-CM | POA: Diagnosis not present

## 2020-11-11 DIAGNOSIS — N1831 Chronic kidney disease, stage 3a: Secondary | ICD-10-CM | POA: Diagnosis not present

## 2020-11-16 DIAGNOSIS — M25561 Pain in right knee: Secondary | ICD-10-CM | POA: Diagnosis not present

## 2020-12-12 DIAGNOSIS — I10 Essential (primary) hypertension: Secondary | ICD-10-CM | POA: Diagnosis not present

## 2020-12-12 DIAGNOSIS — N1831 Chronic kidney disease, stage 3a: Secondary | ICD-10-CM | POA: Diagnosis not present

## 2020-12-28 DIAGNOSIS — Z23 Encounter for immunization: Secondary | ICD-10-CM | POA: Diagnosis not present

## 2021-01-04 DIAGNOSIS — M1711 Unilateral primary osteoarthritis, right knee: Secondary | ICD-10-CM | POA: Diagnosis not present

## 2021-01-26 DIAGNOSIS — F17210 Nicotine dependence, cigarettes, uncomplicated: Secondary | ICD-10-CM | POA: Diagnosis not present

## 2021-01-26 DIAGNOSIS — I10 Essential (primary) hypertension: Secondary | ICD-10-CM | POA: Diagnosis not present

## 2021-01-26 DIAGNOSIS — N1831 Chronic kidney disease, stage 3a: Secondary | ICD-10-CM | POA: Diagnosis not present

## 2021-01-26 DIAGNOSIS — J410 Simple chronic bronchitis: Secondary | ICD-10-CM | POA: Diagnosis not present

## 2021-01-26 DIAGNOSIS — K219 Gastro-esophageal reflux disease without esophagitis: Secondary | ICD-10-CM | POA: Diagnosis not present

## 2021-02-26 DIAGNOSIS — N1831 Chronic kidney disease, stage 3a: Secondary | ICD-10-CM | POA: Diagnosis not present

## 2021-02-26 DIAGNOSIS — I10 Essential (primary) hypertension: Secondary | ICD-10-CM | POA: Diagnosis not present

## 2021-03-17 DIAGNOSIS — M5416 Radiculopathy, lumbar region: Secondary | ICD-10-CM | POA: Diagnosis not present

## 2021-03-28 DIAGNOSIS — I10 Essential (primary) hypertension: Secondary | ICD-10-CM | POA: Diagnosis not present

## 2021-03-28 DIAGNOSIS — H35363 Drusen (degenerative) of macula, bilateral: Secondary | ICD-10-CM | POA: Diagnosis not present

## 2021-03-28 DIAGNOSIS — H0011 Chalazion right upper eyelid: Secondary | ICD-10-CM | POA: Diagnosis not present

## 2021-03-28 DIAGNOSIS — H25813 Combined forms of age-related cataract, bilateral: Secondary | ICD-10-CM | POA: Diagnosis not present

## 2021-03-28 DIAGNOSIS — N1831 Chronic kidney disease, stage 3a: Secondary | ICD-10-CM | POA: Diagnosis not present

## 2021-03-28 DIAGNOSIS — H0014 Chalazion left upper eyelid: Secondary | ICD-10-CM | POA: Diagnosis not present

## 2021-04-19 DIAGNOSIS — J029 Acute pharyngitis, unspecified: Secondary | ICD-10-CM | POA: Diagnosis not present

## 2021-04-19 DIAGNOSIS — I10 Essential (primary) hypertension: Secondary | ICD-10-CM | POA: Diagnosis not present

## 2021-04-25 DIAGNOSIS — M13861 Other specified arthritis, right knee: Secondary | ICD-10-CM | POA: Diagnosis not present

## 2021-05-05 DIAGNOSIS — R300 Dysuria: Secondary | ICD-10-CM | POA: Diagnosis not present

## 2021-05-05 DIAGNOSIS — R369 Urethral discharge, unspecified: Secondary | ICD-10-CM | POA: Diagnosis not present

## 2021-05-05 DIAGNOSIS — R350 Frequency of micturition: Secondary | ICD-10-CM | POA: Diagnosis not present

## 2021-05-19 DIAGNOSIS — M13861 Other specified arthritis, right knee: Secondary | ICD-10-CM | POA: Diagnosis not present

## 2021-05-20 DIAGNOSIS — I10 Essential (primary) hypertension: Secondary | ICD-10-CM | POA: Diagnosis not present

## 2021-05-20 DIAGNOSIS — M129 Arthropathy, unspecified: Secondary | ICD-10-CM | POA: Diagnosis not present

## 2021-06-17 DIAGNOSIS — I10 Essential (primary) hypertension: Secondary | ICD-10-CM | POA: Diagnosis not present

## 2021-06-17 DIAGNOSIS — M549 Dorsalgia, unspecified: Secondary | ICD-10-CM | POA: Diagnosis not present

## 2021-06-28 DIAGNOSIS — R369 Urethral discharge, unspecified: Secondary | ICD-10-CM | POA: Diagnosis not present

## 2021-06-28 DIAGNOSIS — R350 Frequency of micturition: Secondary | ICD-10-CM | POA: Diagnosis not present

## 2021-07-07 DIAGNOSIS — M13861 Other specified arthritis, right knee: Secondary | ICD-10-CM | POA: Diagnosis not present

## 2021-07-07 DIAGNOSIS — M25561 Pain in right knee: Secondary | ICD-10-CM | POA: Diagnosis not present

## 2021-07-27 DIAGNOSIS — I10 Essential (primary) hypertension: Secondary | ICD-10-CM | POA: Diagnosis not present

## 2021-07-27 DIAGNOSIS — F17210 Nicotine dependence, cigarettes, uncomplicated: Secondary | ICD-10-CM | POA: Diagnosis not present

## 2021-07-27 DIAGNOSIS — Z1389 Encounter for screening for other disorder: Secondary | ICD-10-CM | POA: Diagnosis not present

## 2021-07-27 DIAGNOSIS — Z1159 Encounter for screening for other viral diseases: Secondary | ICD-10-CM | POA: Diagnosis not present

## 2021-07-27 DIAGNOSIS — N1831 Chronic kidney disease, stage 3a: Secondary | ICD-10-CM | POA: Diagnosis not present

## 2021-07-27 DIAGNOSIS — Z23 Encounter for immunization: Secondary | ICD-10-CM | POA: Diagnosis not present

## 2021-07-27 DIAGNOSIS — Z0001 Encounter for general adult medical examination with abnormal findings: Secondary | ICD-10-CM | POA: Diagnosis not present

## 2021-07-27 DIAGNOSIS — K219 Gastro-esophageal reflux disease without esophagitis: Secondary | ICD-10-CM | POA: Diagnosis not present

## 2021-07-29 DIAGNOSIS — I10 Essential (primary) hypertension: Secondary | ICD-10-CM | POA: Diagnosis not present

## 2021-07-29 DIAGNOSIS — M25551 Pain in right hip: Secondary | ICD-10-CM | POA: Diagnosis not present

## 2021-08-04 DIAGNOSIS — M5416 Radiculopathy, lumbar region: Secondary | ICD-10-CM | POA: Diagnosis not present

## 2021-08-28 DIAGNOSIS — I10 Essential (primary) hypertension: Secondary | ICD-10-CM | POA: Diagnosis not present

## 2021-08-28 DIAGNOSIS — K219 Gastro-esophageal reflux disease without esophagitis: Secondary | ICD-10-CM | POA: Diagnosis not present

## 2021-08-31 DIAGNOSIS — M25561 Pain in right knee: Secondary | ICD-10-CM | POA: Diagnosis not present

## 2021-09-28 DIAGNOSIS — I10 Essential (primary) hypertension: Secondary | ICD-10-CM | POA: Diagnosis not present

## 2021-09-28 DIAGNOSIS — F17290 Nicotine dependence, other tobacco product, uncomplicated: Secondary | ICD-10-CM | POA: Diagnosis not present

## 2021-10-28 DIAGNOSIS — N1831 Chronic kidney disease, stage 3a: Secondary | ICD-10-CM | POA: Diagnosis not present

## 2021-10-28 DIAGNOSIS — I10 Essential (primary) hypertension: Secondary | ICD-10-CM | POA: Diagnosis not present

## 2021-11-01 DIAGNOSIS — M25561 Pain in right knee: Secondary | ICD-10-CM | POA: Diagnosis not present

## 2021-11-22 DIAGNOSIS — F17290 Nicotine dependence, other tobacco product, uncomplicated: Secondary | ICD-10-CM | POA: Diagnosis not present

## 2021-11-22 DIAGNOSIS — I10 Essential (primary) hypertension: Secondary | ICD-10-CM | POA: Diagnosis not present

## 2021-11-22 DIAGNOSIS — J40 Bronchitis, not specified as acute or chronic: Secondary | ICD-10-CM | POA: Diagnosis not present

## 2021-12-23 DIAGNOSIS — N1831 Chronic kidney disease, stage 3a: Secondary | ICD-10-CM | POA: Diagnosis not present

## 2021-12-23 DIAGNOSIS — I10 Essential (primary) hypertension: Secondary | ICD-10-CM | POA: Diagnosis not present

## 2021-12-24 DIAGNOSIS — M47816 Spondylosis without myelopathy or radiculopathy, lumbar region: Secondary | ICD-10-CM | POA: Diagnosis not present

## 2022-01-22 DIAGNOSIS — I10 Essential (primary) hypertension: Secondary | ICD-10-CM | POA: Diagnosis not present

## 2022-01-22 DIAGNOSIS — J40 Bronchitis, not specified as acute or chronic: Secondary | ICD-10-CM | POA: Diagnosis not present

## 2022-02-14 DIAGNOSIS — M25561 Pain in right knee: Secondary | ICD-10-CM | POA: Diagnosis not present

## 2022-03-02 DIAGNOSIS — I10 Essential (primary) hypertension: Secondary | ICD-10-CM | POA: Diagnosis not present

## 2022-03-02 DIAGNOSIS — J0100 Acute maxillary sinusitis, unspecified: Secondary | ICD-10-CM | POA: Diagnosis not present

## 2022-03-02 DIAGNOSIS — F17290 Nicotine dependence, other tobacco product, uncomplicated: Secondary | ICD-10-CM | POA: Diagnosis not present

## 2022-03-02 DIAGNOSIS — N1831 Chronic kidney disease, stage 3a: Secondary | ICD-10-CM | POA: Diagnosis not present

## 2022-03-02 DIAGNOSIS — J40 Bronchitis, not specified as acute or chronic: Secondary | ICD-10-CM | POA: Diagnosis not present

## 2022-03-09 DIAGNOSIS — M25561 Pain in right knee: Secondary | ICD-10-CM | POA: Diagnosis not present

## 2022-03-16 DIAGNOSIS — M5416 Radiculopathy, lumbar region: Secondary | ICD-10-CM | POA: Diagnosis not present

## 2022-04-01 DIAGNOSIS — I10 Essential (primary) hypertension: Secondary | ICD-10-CM | POA: Diagnosis not present

## 2022-04-01 DIAGNOSIS — N1831 Chronic kidney disease, stage 3a: Secondary | ICD-10-CM | POA: Diagnosis not present

## 2022-04-11 DIAGNOSIS — H35363 Drusen (degenerative) of macula, bilateral: Secondary | ICD-10-CM | POA: Diagnosis not present

## 2022-04-11 DIAGNOSIS — H43813 Vitreous degeneration, bilateral: Secondary | ICD-10-CM | POA: Diagnosis not present

## 2022-04-11 DIAGNOSIS — H524 Presbyopia: Secondary | ICD-10-CM | POA: Diagnosis not present

## 2022-04-11 DIAGNOSIS — H25813 Combined forms of age-related cataract, bilateral: Secondary | ICD-10-CM | POA: Diagnosis not present

## 2022-04-11 DIAGNOSIS — H04123 Dry eye syndrome of bilateral lacrimal glands: Secondary | ICD-10-CM | POA: Diagnosis not present

## 2022-04-12 DIAGNOSIS — N1831 Chronic kidney disease, stage 3a: Secondary | ICD-10-CM | POA: Diagnosis not present

## 2022-04-12 DIAGNOSIS — K219 Gastro-esophageal reflux disease without esophagitis: Secondary | ICD-10-CM | POA: Diagnosis not present

## 2022-04-12 DIAGNOSIS — Z1389 Encounter for screening for other disorder: Secondary | ICD-10-CM | POA: Diagnosis not present

## 2022-04-12 DIAGNOSIS — Z0001 Encounter for general adult medical examination with abnormal findings: Secondary | ICD-10-CM | POA: Diagnosis not present

## 2022-04-12 DIAGNOSIS — I10 Essential (primary) hypertension: Secondary | ICD-10-CM | POA: Diagnosis not present

## 2022-04-12 DIAGNOSIS — F17290 Nicotine dependence, other tobacco product, uncomplicated: Secondary | ICD-10-CM | POA: Diagnosis not present

## 2022-05-13 DIAGNOSIS — N1831 Chronic kidney disease, stage 3a: Secondary | ICD-10-CM | POA: Diagnosis not present

## 2022-05-13 DIAGNOSIS — I10 Essential (primary) hypertension: Secondary | ICD-10-CM | POA: Diagnosis not present

## 2022-06-11 DIAGNOSIS — N1831 Chronic kidney disease, stage 3a: Secondary | ICD-10-CM | POA: Diagnosis not present

## 2022-06-11 DIAGNOSIS — I10 Essential (primary) hypertension: Secondary | ICD-10-CM | POA: Diagnosis not present

## 2022-06-22 DIAGNOSIS — M25561 Pain in right knee: Secondary | ICD-10-CM | POA: Diagnosis not present

## 2022-07-12 DIAGNOSIS — N1831 Chronic kidney disease, stage 3a: Secondary | ICD-10-CM | POA: Diagnosis not present

## 2022-07-12 DIAGNOSIS — I10 Essential (primary) hypertension: Secondary | ICD-10-CM | POA: Diagnosis not present

## 2022-08-11 DIAGNOSIS — I10 Essential (primary) hypertension: Secondary | ICD-10-CM | POA: Diagnosis not present

## 2022-08-11 DIAGNOSIS — N1831 Chronic kidney disease, stage 3a: Secondary | ICD-10-CM | POA: Diagnosis not present

## 2022-09-11 DIAGNOSIS — I10 Essential (primary) hypertension: Secondary | ICD-10-CM | POA: Diagnosis not present

## 2022-09-11 DIAGNOSIS — N1831 Chronic kidney disease, stage 3a: Secondary | ICD-10-CM | POA: Diagnosis not present

## 2022-09-20 DIAGNOSIS — M25561 Pain in right knee: Secondary | ICD-10-CM | POA: Diagnosis not present

## 2022-09-21 DIAGNOSIS — M79642 Pain in left hand: Secondary | ICD-10-CM | POA: Diagnosis not present

## 2022-09-25 DIAGNOSIS — M79642 Pain in left hand: Secondary | ICD-10-CM | POA: Diagnosis not present

## 2022-09-28 DIAGNOSIS — M79642 Pain in left hand: Secondary | ICD-10-CM | POA: Diagnosis not present

## 2022-10-07 DIAGNOSIS — G5622 Lesion of ulnar nerve, left upper limb: Secondary | ICD-10-CM | POA: Diagnosis not present

## 2022-10-07 DIAGNOSIS — G5602 Carpal tunnel syndrome, left upper limb: Secondary | ICD-10-CM | POA: Diagnosis not present

## 2022-10-09 DIAGNOSIS — N1831 Chronic kidney disease, stage 3a: Secondary | ICD-10-CM | POA: Diagnosis not present

## 2022-10-09 DIAGNOSIS — I10 Essential (primary) hypertension: Secondary | ICD-10-CM | POA: Diagnosis not present

## 2022-10-09 DIAGNOSIS — K219 Gastro-esophageal reflux disease without esophagitis: Secondary | ICD-10-CM | POA: Diagnosis not present

## 2022-10-24 DIAGNOSIS — G5602 Carpal tunnel syndrome, left upper limb: Secondary | ICD-10-CM | POA: Diagnosis not present

## 2022-11-09 DIAGNOSIS — N1831 Chronic kidney disease, stage 3a: Secondary | ICD-10-CM | POA: Diagnosis not present

## 2022-11-09 DIAGNOSIS — I10 Essential (primary) hypertension: Secondary | ICD-10-CM | POA: Diagnosis not present

## 2022-11-21 DIAGNOSIS — G5601 Carpal tunnel syndrome, right upper limb: Secondary | ICD-10-CM | POA: Diagnosis not present

## 2022-12-10 DIAGNOSIS — N1831 Chronic kidney disease, stage 3a: Secondary | ICD-10-CM | POA: Diagnosis not present

## 2022-12-10 DIAGNOSIS — I10 Essential (primary) hypertension: Secondary | ICD-10-CM | POA: Diagnosis not present

## 2022-12-12 DIAGNOSIS — Z23 Encounter for immunization: Secondary | ICD-10-CM | POA: Diagnosis not present

## 2022-12-26 DIAGNOSIS — G5603 Carpal tunnel syndrome, bilateral upper limbs: Secondary | ICD-10-CM | POA: Diagnosis not present

## 2023-01-11 DIAGNOSIS — N1831 Chronic kidney disease, stage 3a: Secondary | ICD-10-CM | POA: Diagnosis not present

## 2023-01-11 DIAGNOSIS — I10 Essential (primary) hypertension: Secondary | ICD-10-CM | POA: Diagnosis not present

## 2023-01-16 DIAGNOSIS — M25561 Pain in right knee: Secondary | ICD-10-CM | POA: Diagnosis not present

## 2023-02-05 DIAGNOSIS — R1032 Left lower quadrant pain: Secondary | ICD-10-CM | POA: Diagnosis not present

## 2023-02-11 DIAGNOSIS — I10 Essential (primary) hypertension: Secondary | ICD-10-CM | POA: Diagnosis not present

## 2023-02-11 DIAGNOSIS — N1831 Chronic kidney disease, stage 3a: Secondary | ICD-10-CM | POA: Diagnosis not present

## 2023-03-27 ENCOUNTER — Ambulatory Visit
Admission: EM | Admit: 2023-03-27 | Discharge: 2023-03-27 | Disposition: A | Discharge status: Home / Self Care | Payer: Medicare Other

## 2023-03-27 ENCOUNTER — Encounter: Payer: Self-pay | Admitting: Emergency Medicine

## 2023-03-27 ENCOUNTER — Other Ambulatory Visit: Payer: Self-pay

## 2023-03-27 DIAGNOSIS — Z20822 Contact with and (suspected) exposure to covid-19: Secondary | ICD-10-CM

## 2023-03-27 HISTORY — DX: Essential (primary) hypertension: I10

## 2023-03-27 LAB — POC COVID19/FLU A&B COMBO
Covid Antigen, POC: NEGATIVE
Influenza A Antigen, POC: NEGATIVE
Influenza B Antigen, POC: NEGATIVE

## 2023-03-27 NOTE — Discharge Instructions (Signed)
Your COVID and flu test was negative today.  Continue to monitor for symptoms since you have been exposed.  This does not mean that you will definitely not become sick with COVID given your exposure but just that you are currently testing negative.

## 2023-03-27 NOTE — ED Provider Notes (Signed)
[facility]    CSN: [id number] Arrival date & time: 03/27/23  1249      History   Chief Complaint Chief Complaint  Patient presents with   Covid Exposure    HPI Brett Porter is a 77 y.o. male.   Patient presenting today with request for COVID test as he brought his friend in this morning and they tested positive for COVID.  He denies any symptoms currently to include cough, fevers, aches, chest pain, shortness of breath.  He states he is feeling in his baseline health.    Past Medical History:  Diagnosis Date   Hypertension     There are no active problems to display for this patient.      Home Medications    Prior to Admission medications   Medication Sig Start Date End Date Taking? Authorizing Provider  amLODipine (NORVASC) 10 MG tablet Take 1 tablet by mouth daily.    [provider]    Family History No family history on file.  Social History     Allergies   Patient has no allergy information on record.   Review of Systems Review of Systems Per HPI  Physical Exam Triage Vital Signs ED Triage Vitals  Encounter Vitals Group     BP 03/27/23 1402 134/86     Systolic BP Percentile --      Diastolic BP Percentile --      Pulse Rate 03/27/23 1402 77     Resp 03/27/23 1402 20     Temp 03/27/23 1402 98.6 F (37 C)     Temp Source 03/27/23 1402 Oral     SpO2 03/27/23 1402 97 %     Weight --      Height --      Head Circumference --      Peak Flow --      Pain Score 03/27/23 1400 0     Pain Loc --      Pain Education --      Exclude from Growth Chart --    No data found.  Updated Vital Signs BP 134/86 (BP Location: Right Arm)   Pulse 77   Temp 98.6 F (37 C) (Oral)   Resp 20   SpO2 97%   Visual Acuity Right Eye Distance:   Left Eye Distance:   Bilateral Distance:    Right Eye Near:   Left Eye Near:    Bilateral Near:     Physical Exam Vitals and nursing note reviewed.  Constitutional:       Appearance: Normal appearance.  HENT:     Head: Atraumatic.     Nose: Nose normal.     Mouth/Throat:     Mouth: Mucous membranes are moist.     Pharynx: Oropharynx is clear. No posterior oropharyngeal erythema.  Eyes:     Extraocular Movements: Extraocular movements intact.     Conjunctiva/sclera: Conjunctivae normal.  Cardiovascular:     Rate and Rhythm: Normal rate and regular rhythm.     Heart sounds: Normal heart sounds.  Pulmonary:     Effort: Pulmonary effort is normal.     Breath sounds: Normal breath sounds.  Musculoskeletal:        General: Normal range of motion.     Cervical back: Normal range of motion and neck supple.  Skin:    General: Skin is warm and dry.  Neurological:     General: No focal deficit present.     Mental Status: He  is oriented to person, place, and time.     Motor: No weakness.     Gait: Gait normal.  Psychiatric:        Mood and Affect: Mood normal.        Thought Content: Thought content normal.        Judgment: Judgment normal.      UC Treatments / Results  Labs (all labs ordered are listed, but only abnormal results are displayed) Labs Reviewed  POC COVID19/FLU A&B COMBO    EKG   Radiology No results found.  Procedures Procedures (including critical care time)  Medications Ordered in UC Medications - No data to display  Initial Impression / Assessment and Plan / UC Course  I have reviewed the triage vital signs and the nursing notes.  Pertinent labs & imaging results that were available during my care of the patient were reviewed by me and considered in my medical decision making (see chart for details).     Vital signs and exam within normal limits today.  COVID and flu testing in clinic is negative.  Did discuss with patient that this does not mean that he will not ultimately begin symptoms or become ill but that at this current moment he is testing negative.  Discussed to follow-up if becoming symptomatic  Final  Clinical Impressions(s) / UC Diagnoses   Final diagnoses:  Exposure to COVID-19 virus     Discharge Instructions      Your COVID and flu test was negative today.  Continue to monitor for symptoms since you have been exposed.  This does not mean that you will definitely not become sick with COVID given your exposure but just that you are currently testing negative.    ED Prescriptions   None    PDMP not reviewed this encounter.   Particia Nearing, [place] 03/27/23 1512

## 2023-03-27 NOTE — ED Triage Notes (Signed)
Pt reports "friend was in this morning and tested positive for COVID and would like to be tested."Denies any new symptoms. Reports chronic allergies.

## 2023-04-10 DIAGNOSIS — F17290 Nicotine dependence, other tobacco product, uncomplicated: Secondary | ICD-10-CM | POA: Diagnosis not present

## 2023-04-10 DIAGNOSIS — K219 Gastro-esophageal reflux disease without esophagitis: Secondary | ICD-10-CM | POA: Diagnosis not present

## 2023-04-10 DIAGNOSIS — F17210 Nicotine dependence, cigarettes, uncomplicated: Secondary | ICD-10-CM | POA: Diagnosis not present

## 2023-04-10 DIAGNOSIS — N1831 Chronic kidney disease, stage 3a: Secondary | ICD-10-CM | POA: Diagnosis not present

## 2023-04-10 DIAGNOSIS — Z0001 Encounter for general adult medical examination with abnormal findings: Secondary | ICD-10-CM | POA: Diagnosis not present

## 2023-04-10 DIAGNOSIS — I10 Essential (primary) hypertension: Secondary | ICD-10-CM | POA: Diagnosis not present

## 2023-04-10 DIAGNOSIS — Z1389 Encounter for screening for other disorder: Secondary | ICD-10-CM | POA: Diagnosis not present

## 2023-04-11 DIAGNOSIS — M25561 Pain in right knee: Secondary | ICD-10-CM | POA: Diagnosis not present

## 2023-05-11 DIAGNOSIS — N1831 Chronic kidney disease, stage 3a: Secondary | ICD-10-CM | POA: Diagnosis not present

## 2023-05-11 DIAGNOSIS — I10 Essential (primary) hypertension: Secondary | ICD-10-CM | POA: Diagnosis not present

## 2023-06-08 DIAGNOSIS — I10 Essential (primary) hypertension: Secondary | ICD-10-CM | POA: Diagnosis not present

## 2023-06-08 DIAGNOSIS — N1831 Chronic kidney disease, stage 3a: Secondary | ICD-10-CM | POA: Diagnosis not present

## 2023-06-26 DIAGNOSIS — M5416 Radiculopathy, lumbar region: Secondary | ICD-10-CM | POA: Diagnosis not present

## 2023-07-09 DIAGNOSIS — I10 Essential (primary) hypertension: Secondary | ICD-10-CM | POA: Diagnosis not present

## 2023-07-09 DIAGNOSIS — N1831 Chronic kidney disease, stage 3a: Secondary | ICD-10-CM | POA: Diagnosis not present

## 2023-08-08 DIAGNOSIS — N1831 Chronic kidney disease, stage 3a: Secondary | ICD-10-CM | POA: Diagnosis not present

## 2023-08-08 DIAGNOSIS — I10 Essential (primary) hypertension: Secondary | ICD-10-CM | POA: Diagnosis not present

## 2023-09-08 DIAGNOSIS — N1831 Chronic kidney disease, stage 3a: Secondary | ICD-10-CM | POA: Diagnosis not present

## 2023-09-08 DIAGNOSIS — I10 Essential (primary) hypertension: Secondary | ICD-10-CM | POA: Diagnosis not present

## 2023-10-02 DIAGNOSIS — N1831 Chronic kidney disease, stage 3a: Secondary | ICD-10-CM | POA: Diagnosis not present

## 2023-10-02 DIAGNOSIS — F17210 Nicotine dependence, cigarettes, uncomplicated: Secondary | ICD-10-CM | POA: Diagnosis not present

## 2023-10-02 DIAGNOSIS — K219 Gastro-esophageal reflux disease without esophagitis: Secondary | ICD-10-CM | POA: Diagnosis not present

## 2023-10-02 DIAGNOSIS — F17290 Nicotine dependence, other tobacco product, uncomplicated: Secondary | ICD-10-CM | POA: Diagnosis not present

## 2023-10-02 DIAGNOSIS — I10 Essential (primary) hypertension: Secondary | ICD-10-CM | POA: Diagnosis not present

## 2023-10-15 DIAGNOSIS — M25561 Pain in right knee: Secondary | ICD-10-CM | POA: Diagnosis not present

## 2023-11-02 DIAGNOSIS — N1831 Chronic kidney disease, stage 3a: Secondary | ICD-10-CM | POA: Diagnosis not present

## 2023-11-02 DIAGNOSIS — I10 Essential (primary) hypertension: Secondary | ICD-10-CM | POA: Diagnosis not present

## 2023-12-03 DIAGNOSIS — N1831 Chronic kidney disease, stage 3a: Secondary | ICD-10-CM | POA: Diagnosis not present

## 2023-12-03 DIAGNOSIS — I10 Essential (primary) hypertension: Secondary | ICD-10-CM | POA: Diagnosis not present

## 2023-12-12 DIAGNOSIS — J310 Chronic rhinitis: Secondary | ICD-10-CM | POA: Diagnosis not present

## 2023-12-12 DIAGNOSIS — R42 Dizziness and giddiness: Secondary | ICD-10-CM | POA: Diagnosis not present

## 2024-01-10 DIAGNOSIS — H524 Presbyopia: Secondary | ICD-10-CM | POA: Diagnosis not present

## 2024-01-10 DIAGNOSIS — H35363 Drusen (degenerative) of macula, bilateral: Secondary | ICD-10-CM | POA: Diagnosis not present

## 2024-01-10 DIAGNOSIS — H35033 Hypertensive retinopathy, bilateral: Secondary | ICD-10-CM | POA: Diagnosis not present

## 2024-01-10 DIAGNOSIS — H25813 Combined forms of age-related cataract, bilateral: Secondary | ICD-10-CM | POA: Diagnosis not present

## 2024-01-12 DIAGNOSIS — M25561 Pain in right knee: Secondary | ICD-10-CM | POA: Diagnosis not present

## 2024-01-20 DIAGNOSIS — I10 Essential (primary) hypertension: Secondary | ICD-10-CM | POA: Diagnosis not present

## 2024-01-20 DIAGNOSIS — N1831 Chronic kidney disease, stage 3a: Secondary | ICD-10-CM | POA: Diagnosis not present

## 2024-04-17 ENCOUNTER — Other Ambulatory Visit (HOSPITAL_COMMUNITY): Payer: Self-pay | Admitting: Gerontology

## 2024-04-17 ENCOUNTER — Encounter (HOSPITAL_COMMUNITY): Payer: Self-pay | Admitting: Gerontology

## 2024-04-17 DIAGNOSIS — Z87891 Personal history of nicotine dependence: Secondary | ICD-10-CM

## 2024-04-25 ENCOUNTER — Ambulatory Visit (HOSPITAL_COMMUNITY)
Admission: RE | Admit: 2024-04-25 | Discharge: 2024-04-25 | Disposition: A | Source: Ambulatory Visit | Attending: Gerontology | Admitting: Gerontology

## 2024-04-25 DIAGNOSIS — I2510 Atherosclerotic heart disease of native coronary artery without angina pectoris: Secondary | ICD-10-CM | POA: Diagnosis not present

## 2024-04-25 DIAGNOSIS — I7123 Aneurysm of the descending thoracic aorta, without rupture: Secondary | ICD-10-CM | POA: Insufficient documentation

## 2024-04-25 DIAGNOSIS — I700 Atherosclerosis of aorta: Secondary | ICD-10-CM | POA: Diagnosis not present

## 2024-04-25 DIAGNOSIS — F17210 Nicotine dependence, cigarettes, uncomplicated: Secondary | ICD-10-CM | POA: Insufficient documentation

## 2024-04-25 DIAGNOSIS — Z87891 Personal history of nicotine dependence: Secondary | ICD-10-CM | POA: Diagnosis present

## 2024-04-25 DIAGNOSIS — Z122 Encounter for screening for malignant neoplasm of respiratory organs: Secondary | ICD-10-CM | POA: Insufficient documentation

## 2024-04-25 DIAGNOSIS — E041 Nontoxic single thyroid nodule: Secondary | ICD-10-CM | POA: Insufficient documentation

## 2024-04-25 DIAGNOSIS — K8020 Calculus of gallbladder without cholecystitis without obstruction: Secondary | ICD-10-CM | POA: Diagnosis not present
